# Patient Record
Sex: Female | Born: 1960 | Race: White | Hispanic: No | Marital: Married
Health system: Southern US, Community
[De-identification: ages and names within clinical notes are randomized; demographics above are authoritative.]

---

## 2003-09-04 ENCOUNTER — Other Ambulatory Visit: Admission: RE | Admit: 2003-09-04 | Discharge: 2003-09-04 | Payer: Self-pay | Admitting: Obstetrics and Gynecology

## 2004-10-22 ENCOUNTER — Other Ambulatory Visit: Admission: RE | Admit: 2004-10-22 | Discharge: 2004-10-22 | Payer: Self-pay | Admitting: Obstetrics and Gynecology

## 2009-05-21 ENCOUNTER — Encounter: Admission: RE | Admit: 2009-05-21 | Discharge: 2009-05-21 | Payer: Self-pay | Admitting: Gastroenterology

## 2009-07-24 ENCOUNTER — Encounter: Admission: RE | Admit: 2009-07-24 | Discharge: 2009-07-24 | Payer: Self-pay | Admitting: General Surgery

## 2009-07-24 ENCOUNTER — Other Ambulatory Visit: Admission: RE | Admit: 2009-07-24 | Discharge: 2009-07-24 | Payer: Self-pay | Admitting: Interventional Radiology

## 2014-03-13 ENCOUNTER — Other Ambulatory Visit: Payer: Self-pay | Admitting: Obstetrics and Gynecology

## 2014-03-14 LAB — CYTOLOGY - PAP

## 2014-03-19 ENCOUNTER — Other Ambulatory Visit: Payer: Self-pay | Admitting: Obstetrics and Gynecology

## 2014-03-19 DIAGNOSIS — R928 Other abnormal and inconclusive findings on diagnostic imaging of breast: Secondary | ICD-10-CM

## 2014-04-05 ENCOUNTER — Other Ambulatory Visit: Payer: Self-pay

## 2014-04-05 ENCOUNTER — Encounter (INDEPENDENT_AMBULATORY_CARE_PROVIDER_SITE_OTHER): Payer: Self-pay

## 2014-04-05 ENCOUNTER — Ambulatory Visit
Admission: RE | Admit: 2014-04-05 | Discharge: 2014-04-05 | Disposition: A | Discharge status: Home / Self Care | Payer: BC Managed Care – PPO | Source: Ambulatory Visit | Attending: Obstetrics and Gynecology | Admitting: Obstetrics and Gynecology

## 2014-04-05 DIAGNOSIS — R928 Other abnormal and inconclusive findings on diagnostic imaging of breast: Secondary | ICD-10-CM

## 2014-10-09 ENCOUNTER — Other Ambulatory Visit: Payer: Self-pay | Admitting: Obstetrics and Gynecology

## 2014-10-09 DIAGNOSIS — N6310 Unspecified lump in the right breast, unspecified quadrant: Secondary | ICD-10-CM

## 2014-10-16 ENCOUNTER — Other Ambulatory Visit: Payer: Self-pay

## 2014-10-23 ENCOUNTER — Ambulatory Visit
Admission: RE | Admit: 2014-10-23 | Discharge: 2014-10-23 | Disposition: A | Discharge status: Home / Self Care | Payer: BLUE CROSS/BLUE SHIELD | Source: Ambulatory Visit | Attending: Obstetrics and Gynecology | Admitting: Obstetrics and Gynecology

## 2014-10-23 DIAGNOSIS — N6310 Unspecified lump in the right breast, unspecified quadrant: Secondary | ICD-10-CM

## 2015-04-11 ENCOUNTER — Other Ambulatory Visit: Payer: Self-pay | Admitting: Obstetrics and Gynecology

## 2015-04-11 DIAGNOSIS — N6001 Solitary cyst of right breast: Secondary | ICD-10-CM

## 2015-04-29 ENCOUNTER — Ambulatory Visit
Admission: RE | Admit: 2015-04-29 | Discharge: 2015-04-29 | Disposition: A | Discharge status: Home / Self Care | Payer: BLUE CROSS/BLUE SHIELD | Source: Ambulatory Visit | Attending: Obstetrics and Gynecology | Admitting: Obstetrics and Gynecology

## 2015-04-29 DIAGNOSIS — N6001 Solitary cyst of right breast: Secondary | ICD-10-CM

## 2017-09-18 IMAGING — MG MM DIAG BREAST TOMO BILATERAL
8 series · 8 of 24 positions shown · non-contrast
Comparison: Previous exam(s).

CLINICAL DATA: Followup small probable cluster of cysts in the 5
o'clock position of the right breast.

EXAM:
DIGITAL DIAGNOSTIC BILATERAL MAMMOGRAM WITH 3D TOMOSYNTHESIS WITH
CAD
ULTRASOUND RIGHT BREAST

[R CC]
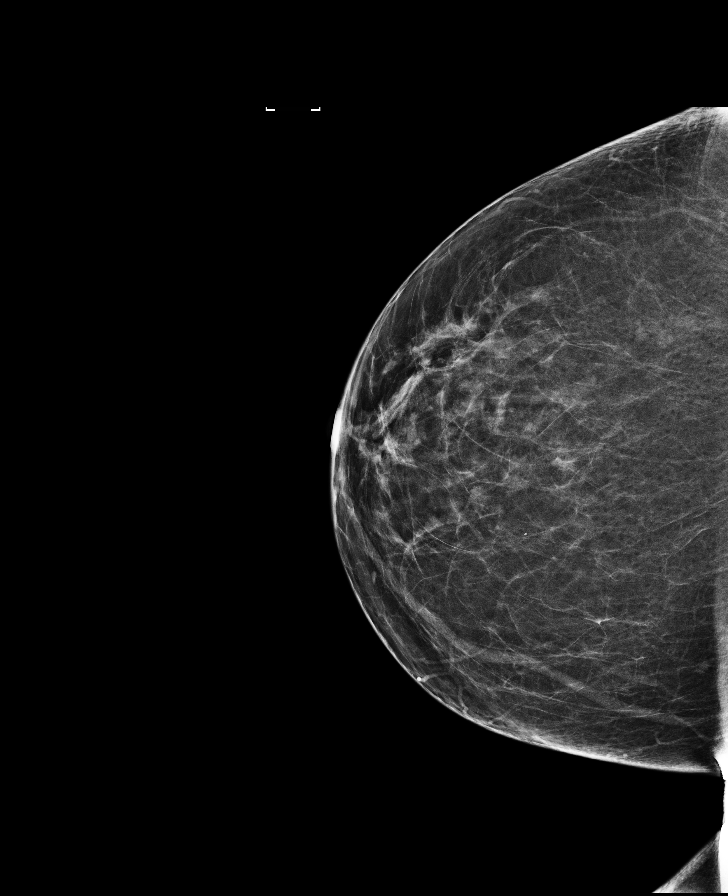

[L CC]
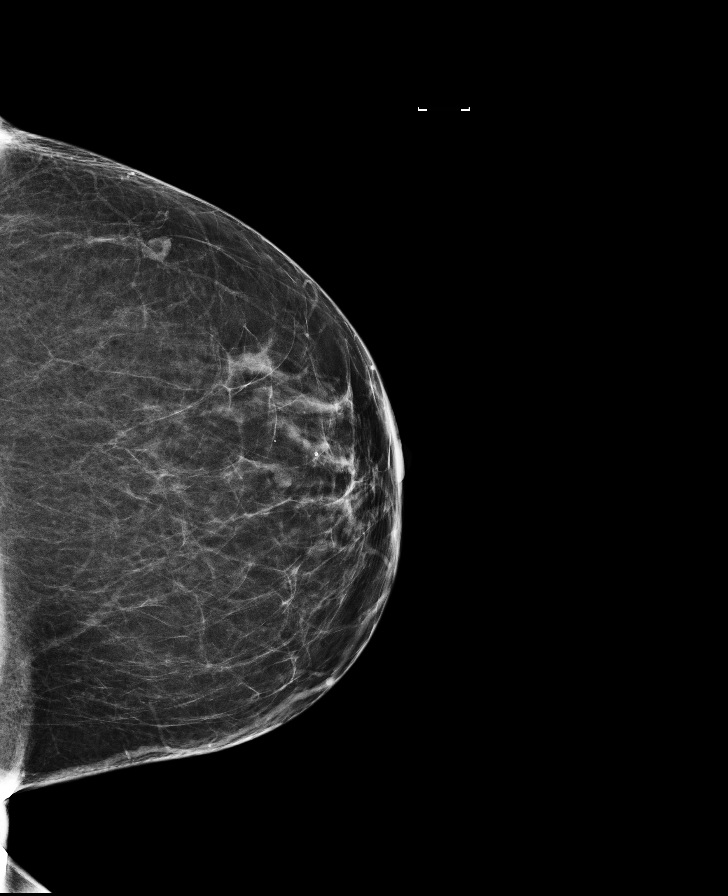

[R MLO]
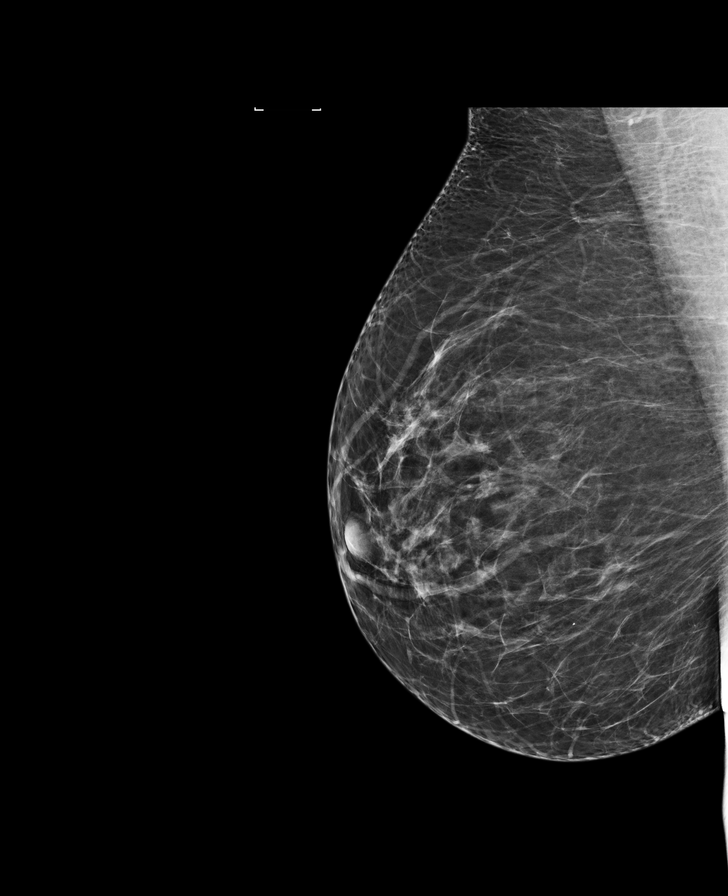

[L MLO]
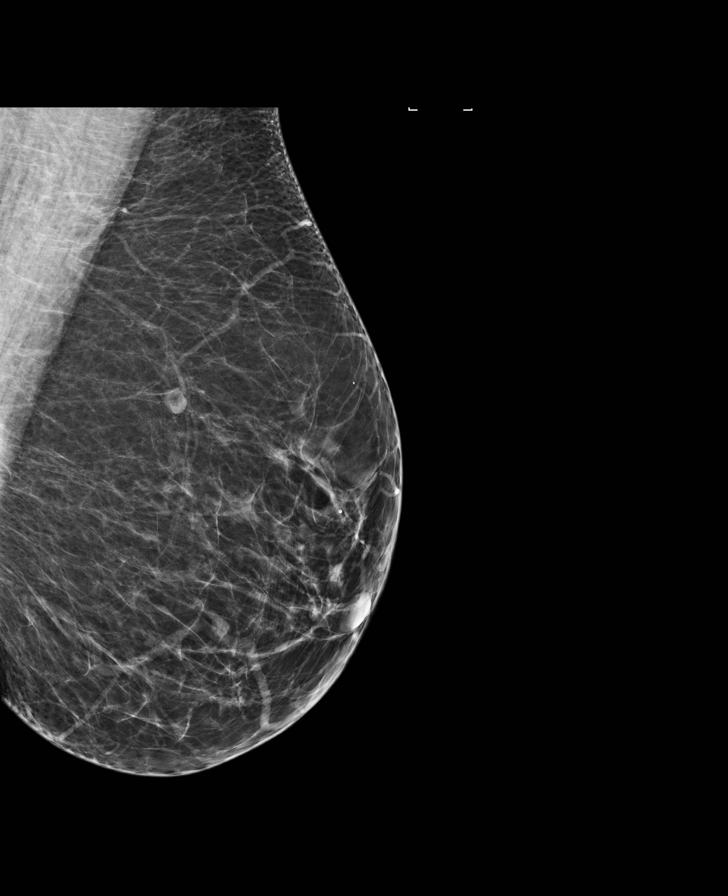

[R MLO tomo · tomo slice 41/81.0]
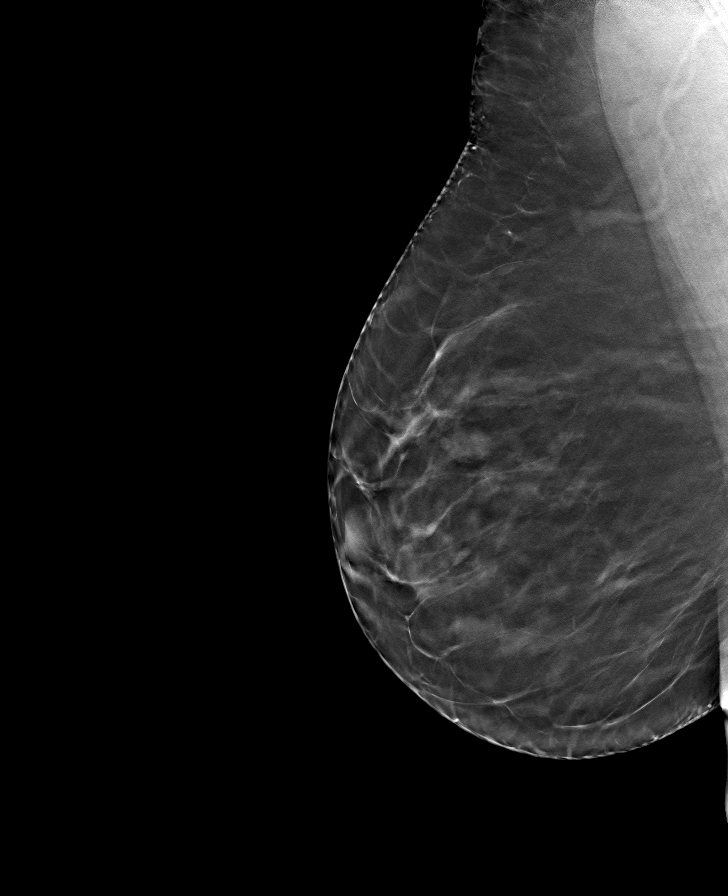

[L MLO tomo · tomo slice 39/76.0]
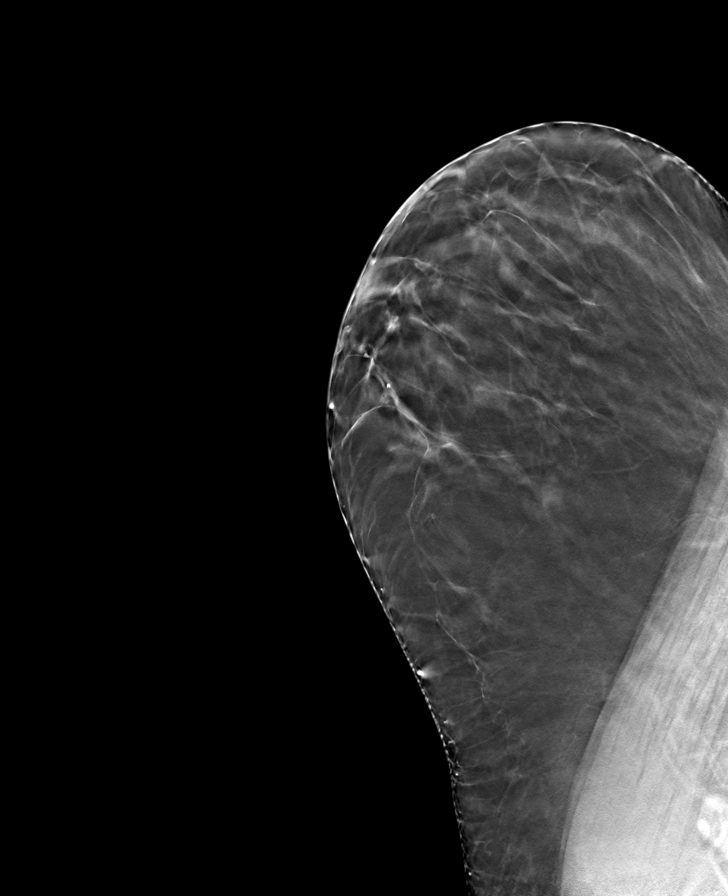

[L CC tomo · tomo slice 37/73.0]
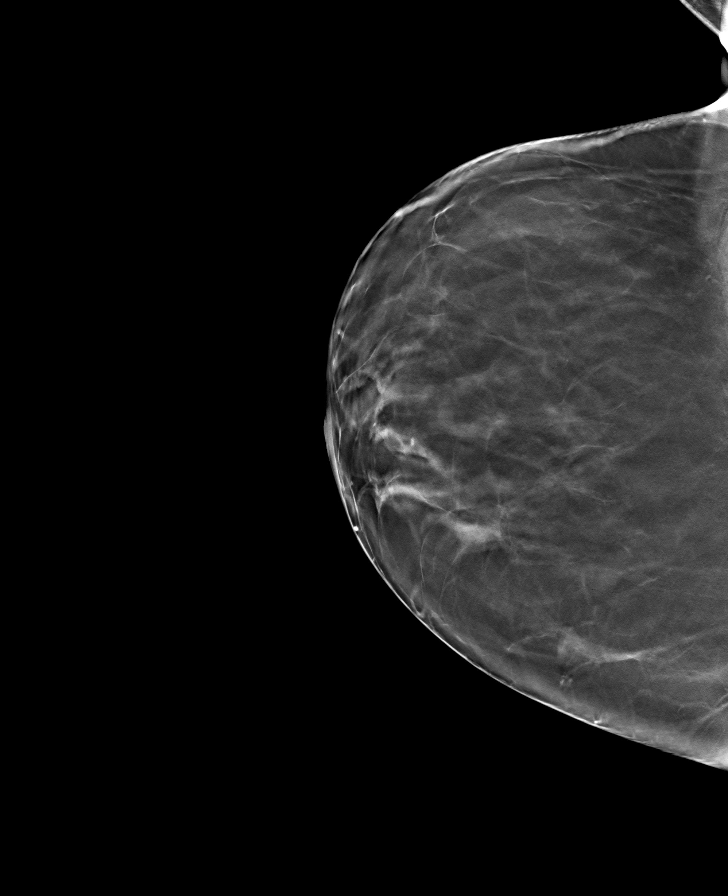

[R CC tomo · tomo slice 37/72.0]
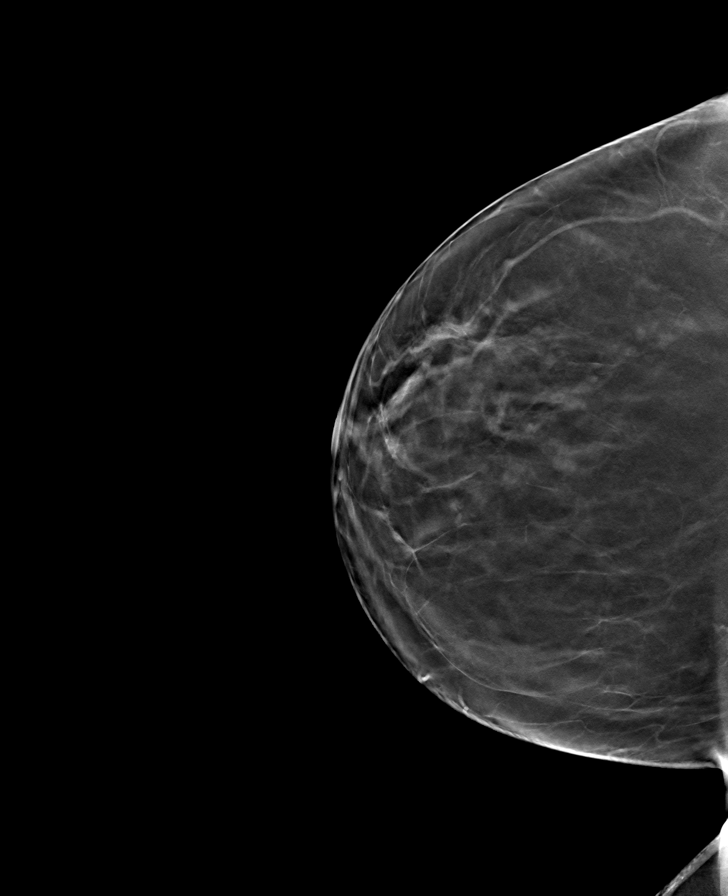

[8 of 24 positions shown; findings below may reference images not displayed]

ACR Breast Density Category b: There are scattered areas of
fibroglandular density.
FINDINGS: The oval, circumscribed nodular density seen in the lower inner
right breast on 03/13/2014 is no longer demonstrated. No new
findings suspicious for malignancy in either breast.

Mammographic images were processed with CAD.

On physical exam, no mass is palpable in the lower inner right
breast.

Targeted ultrasound is performed, showing a 4 x 3 x 3 mm cluster of
microcysts in the 5 o'clock position of the right breast, 3 cm from
the nipple. This measured 5 x 4 x 2 mm on 10/23/2014 and 7 x 4 x 4
mm on 04/05/2014. There are 2 additional similar-appearing clusters
of microcysts in that region of the breast, closer to the nipple.
IMPRESSION: Left breast clusters of microcysts, including an interval decrease
in size of the previously evaluated cluster in the 5 o'clock
position, 3 cm from the nipple. No evidence of malignancy.

RECOMMENDATION:
Bilateral screening mammogram in 1 year.

I have discussed the findings and recommendations with the patient.
Results were also provided in writing at the conclusion of the
visit. If applicable, a reminder letter will be sent to the patient
regarding the next appointment.

BI-RADS CATEGORY  2: Benign.
